# Patient Record
Sex: Female | Born: 1960 | Race: Black or African American | Hispanic: No | Marital: Married | Smoking: Former smoker
Health system: Southern US, Community
[De-identification: ages and names within clinical notes are randomized; demographics above are authoritative.]

## PROBLEM LIST (undated history)

## (undated) DIAGNOSIS — E785 Hyperlipidemia, unspecified: Secondary | ICD-10-CM

## (undated) DIAGNOSIS — G709 Myoneural disorder, unspecified: Secondary | ICD-10-CM

## (undated) DIAGNOSIS — M1990 Unspecified osteoarthritis, unspecified site: Secondary | ICD-10-CM

## (undated) HISTORY — PX: COLONOSCOPY: SHX174

## (undated) HISTORY — PX: ABDOMINAL HYSTERECTOMY: SHX81

## (undated) HISTORY — DX: Hyperlipidemia, unspecified: E78.5

## (undated) HISTORY — DX: Unspecified osteoarthritis, unspecified site: M19.90

## (undated) HISTORY — DX: Myoneural disorder, unspecified: G70.9

---

## 1997-07-26 ENCOUNTER — Emergency Department (HOSPITAL_COMMUNITY): Admission: EM | Admit: 1997-07-26 | Discharge: 1997-07-27 | Payer: Self-pay | Admitting: Emergency Medicine

## 1998-10-05 ENCOUNTER — Encounter: Payer: Self-pay | Admitting: Family Medicine

## 1998-10-05 ENCOUNTER — Encounter (INDEPENDENT_AMBULATORY_CARE_PROVIDER_SITE_OTHER): Payer: Self-pay | Admitting: Specialist

## 1998-10-05 ENCOUNTER — Ambulatory Visit (HOSPITAL_COMMUNITY): Admission: RE | Admit: 1998-10-05 | Discharge: 1998-10-05 | Payer: Self-pay | Admitting: Family Medicine

## 2000-05-18 ENCOUNTER — Other Ambulatory Visit: Admission: RE | Admit: 2000-05-18 | Discharge: 2000-05-18 | Payer: Self-pay | Admitting: Obstetrics and Gynecology

## 2001-09-23 ENCOUNTER — Other Ambulatory Visit: Admission: RE | Admit: 2001-09-23 | Discharge: 2001-09-23 | Payer: Self-pay | Admitting: Obstetrics and Gynecology

## 2001-10-10 ENCOUNTER — Encounter: Admission: RE | Admit: 2001-10-10 | Discharge: 2001-10-10 | Payer: Self-pay | Admitting: Obstetrics and Gynecology

## 2001-10-10 ENCOUNTER — Encounter: Payer: Self-pay | Admitting: Obstetrics and Gynecology

## 2001-10-10 ENCOUNTER — Encounter: Admission: RE | Admit: 2001-10-10 | Discharge: 2002-01-08 | Payer: Self-pay | Admitting: Family Medicine

## 2001-10-16 ENCOUNTER — Encounter: Payer: Self-pay | Admitting: Obstetrics and Gynecology

## 2001-10-16 ENCOUNTER — Encounter: Admission: RE | Admit: 2001-10-16 | Discharge: 2001-10-16 | Payer: Self-pay | Admitting: Obstetrics and Gynecology

## 2003-02-17 ENCOUNTER — Other Ambulatory Visit: Admission: RE | Admit: 2003-02-17 | Discharge: 2003-02-17 | Payer: Self-pay | Admitting: Obstetrics and Gynecology

## 2003-05-26 ENCOUNTER — Encounter (INDEPENDENT_AMBULATORY_CARE_PROVIDER_SITE_OTHER): Payer: Self-pay | Admitting: Specialist

## 2003-05-26 ENCOUNTER — Inpatient Hospital Stay (HOSPITAL_COMMUNITY): Admission: RE | Admit: 2003-05-26 | Discharge: 2003-05-28 | Payer: Self-pay | Admitting: Obstetrics and Gynecology

## 2004-01-17 HISTORY — PX: OTHER SURGICAL HISTORY: SHX169

## 2005-03-02 ENCOUNTER — Encounter: Admission: RE | Admit: 2005-03-02 | Discharge: 2005-03-02 | Payer: Self-pay | Admitting: Family Medicine

## 2006-02-13 ENCOUNTER — Encounter: Admission: RE | Admit: 2006-02-13 | Discharge: 2006-02-13 | Payer: Self-pay | Admitting: Obstetrics and Gynecology

## 2006-02-22 ENCOUNTER — Ambulatory Visit: Payer: Self-pay | Admitting: Cardiology

## 2007-03-13 ENCOUNTER — Emergency Department (HOSPITAL_COMMUNITY): Admission: EM | Admit: 2007-03-13 | Discharge: 2007-03-13 | Payer: Self-pay | Admitting: Emergency Medicine

## 2007-07-03 ENCOUNTER — Emergency Department (HOSPITAL_COMMUNITY): Admission: EM | Admit: 2007-07-03 | Discharge: 2007-07-03 | Payer: Self-pay | Admitting: Family Medicine

## 2008-11-09 ENCOUNTER — Encounter: Admission: RE | Admit: 2008-11-09 | Discharge: 2008-11-09 | Payer: Self-pay | Admitting: Family Medicine

## 2009-12-08 ENCOUNTER — Encounter: Admission: RE | Admit: 2009-12-08 | Discharge: 2009-12-08 | Payer: Self-pay | Admitting: Family Medicine

## 2010-02-06 ENCOUNTER — Encounter: Payer: Self-pay | Admitting: Obstetrics and Gynecology

## 2010-04-01 ENCOUNTER — Ambulatory Visit
Admission: RE | Admit: 2010-04-01 | Discharge: 2010-04-01 | Disposition: A | Discharge status: Home / Self Care | Payer: BC Managed Care – PPO | Source: Ambulatory Visit | Attending: Family Medicine | Admitting: Family Medicine

## 2010-04-01 ENCOUNTER — Other Ambulatory Visit: Payer: Self-pay | Admitting: Family Medicine

## 2010-04-01 DIAGNOSIS — M545 Low back pain: Secondary | ICD-10-CM

## 2010-04-18 ENCOUNTER — Ambulatory Visit: Payer: BC Managed Care – PPO

## 2010-04-25 ENCOUNTER — Ambulatory Visit: Payer: BC Managed Care – PPO | Attending: Family Medicine

## 2010-04-25 DIAGNOSIS — R5381 Other malaise: Secondary | ICD-10-CM | POA: Insufficient documentation

## 2010-04-25 DIAGNOSIS — M25569 Pain in unspecified knee: Secondary | ICD-10-CM | POA: Insufficient documentation

## 2010-04-25 DIAGNOSIS — M545 Low back pain, unspecified: Secondary | ICD-10-CM | POA: Insufficient documentation

## 2010-04-25 DIAGNOSIS — IMO0001 Reserved for inherently not codable concepts without codable children: Secondary | ICD-10-CM | POA: Insufficient documentation

## 2010-04-28 ENCOUNTER — Ambulatory Visit: Payer: BC Managed Care – PPO | Admitting: Physical Therapy

## 2010-05-02 ENCOUNTER — Ambulatory Visit: Payer: BC Managed Care – PPO

## 2010-05-04 ENCOUNTER — Ambulatory Visit: Payer: BC Managed Care – PPO

## 2010-05-09 ENCOUNTER — Ambulatory Visit: Payer: BC Managed Care – PPO

## 2010-05-16 ENCOUNTER — Ambulatory Visit: Payer: BC Managed Care – PPO | Admitting: Physical Therapy

## 2010-06-01 ENCOUNTER — Ambulatory Visit: Payer: BC Managed Care – PPO | Attending: Family Medicine | Admitting: Physical Therapy

## 2010-06-01 DIAGNOSIS — IMO0001 Reserved for inherently not codable concepts without codable children: Secondary | ICD-10-CM | POA: Insufficient documentation

## 2010-06-01 DIAGNOSIS — R5381 Other malaise: Secondary | ICD-10-CM | POA: Insufficient documentation

## 2010-06-01 DIAGNOSIS — M545 Low back pain, unspecified: Secondary | ICD-10-CM | POA: Insufficient documentation

## 2010-06-01 DIAGNOSIS — M25569 Pain in unspecified knee: Secondary | ICD-10-CM | POA: Insufficient documentation

## 2010-06-03 NOTE — H&P (Signed)
NAME:  [name], [name]                        ACCOUNT NO.:  [id number]   MEDICAL RECORD NO.:  [id number]                   PATIENT TYPE:  INP   LOCATION:  NA                                   FACILITY:  [facility]   PHYSICIAN:  Maxie Better, M.D.            DATE OF BIRTH:  August 01, 1960   DATE OF ADMISSION:  05/26/2003  DATE OF DISCHARGE:                                HISTORY & PHYSICAL   PREOPERATIVE HISTORY AND PHYSICAL:   CHIEF COMPLAINT:  Enlarging uterine fibroids, mild cervical dysplasia.   HISTORY OF PRESENT ILLNESS:  This is a 50 year old gravida 100, para 1-0-5-1  married black female with a history of a laparoscopic tubal ligation, last  menstrual period May 21, 2003, with enlarging uterine fibroids who is now  being admitted for a total abdominal hysterectomy.  The patient presented  for annual exam in February 2005, at that time she had regular cycles with  the second day increased flow, no intermenstrual bleeding or postcoital  bleeding.  Her exam was notable for a mass arising to the level of the  umbilicus, nontender, and her pelvic exam notable for uterus that was  anteverted, irregular, about 16 to 18 weeks size.  Due to the clinical  findings ultrasound was obtained on March 20, 2003.  The ultrasound revealed  uterus measuring 20.4 x 8.5 x 11.2 cm with multiple fibroids - the largest  is an anterior pedunculated fibroid measuring 7.26 cm and a posterior fundal  fibroid measuring 9 cm.  Both ovaries are noted to be normal and her  endometrium was thickened.  On comparison of prior ultrasounds (the last one  done on October 07, 2001) the uterus at that time measured 14.1 x 12.8 x  8.4 cm with multiple fibroids and the pedunculated fibroids not noted at  that time.  The patient had further evaluation which included an MRI of the  pelvis which confirmed multiple fibroids as well.  An endometrial biopsy was  performed on April 21, 2003 that showed benign secretory  endometrium, no  hyperplasia or malignancy.  The pelvic MRI was done on April 07, 2003.  Findings were prominent pelvic vascular congestion secondary to the uterine  enlargement, hypervascular leiomyomas at the fundal area of the uterus and  at the uterus had displaced the colon and the rectum and flattened the  bladder roof, bilateral pelvic cysts thought to be either small ovarian or  paraovarian cysts were noted and uterine size was confirmed at 17 cm x 9 x  10 cm.  In addition, the patient had an abnormal Pap smear for which she  underwent colposcopy and subsequent colposcopic directed biopsy in March  2005 that confirmed mild cervical dysplasia.  Her history is notable for  previous cervical dysplasia treated with cryosurgery.  Due to the enlarged  size, patient now presents for surgical management.   PAST MEDICAL HISTORY:   ALLERGIES:  No known drug allergies.  MEDICINES:  None.   MEDICAL HISTORY:  Mild pelvic endometriosis, uterine fibroids.   SURGICAL HISTORY:  Laparoscopic tubal ligation with bipolar cautery and  lysis of omental adhesions December 1995, cryosurgery, right eye surgery,  cesarean section, dilation and curettage.   OBSTETRICAL HISTORY:  Cesarean section x1.  Elective termination x2.   GYNECOLOGIC HISTORY:  CIN-1 condyloma.   FAMILY HISTORY:  Diabetes mother and father.  Father had prostate cancer,  mother glaucoma.  No ovarian, colon, or uterine cancer.   SOCIAL HISTORY:  Married, one child, CNA, nonsmoker.   REVIEW OF SYSTEMS:  Negative.   LABORATORIES:  Mammogram simple cysts in the past.   PHYSICAL EXAMINATION:  GENERAL:  Well-developed, well-nourished black female  in no acute distress.  VITAL SIGNS:  Blood pressure 100/68, pulse of 74, weight 162 pounds.  SKIN:  No lesions.  HEENT:  Anicteric sclerae.  Pink conjunctivae.  Oropharynx negative.  NECK:  Supple.  Thyroid not palpable.  NODES:  No supraclavicular, axillary, or inguinal nodes  palpable.  BREASTS:  Soft, nontender, no palpable mass, upward nipples without  discharge.  BACK:  No CVA tenderness.  LUNGS:  Clear to auscultation.  HEART:  Regular rate and rhythm without murmur.  ABDOMEN:  Possible mass just above the umbilicus, transverse scar noted.  Liver not palpable.  PELVIC:  Vulva shows no lesions.  Vagina no lesions.  Cervix is normal.  Uterus is irregular, anteverted, about 16 to 18 weeks size.  Adnexa not  appreciable secondary to the enlarged uterus.  RECTAL:  Exam deferred.  EXTREMITIES:  No edema.   IMPRESSION:  1. Enlarging uterine fibroids.  2. Mild cervical dysplasia.   PLAN:  Admission.  Total abdominal hysterectomy with ovarian preservation.  Routine admission labs and orders.  Risks of the procedure have been to the  patient and her husband including but not limited to infection, bleeding  which may require blood transfusion, injury to surrounding organ structures  such as the bladder, bowel, ureter, possible loss of the ovary due to blood  vessel compromise or abnormality of the ovaries, internal scar tissue which  could cause pain in the future or bowel obstruction, possible need for  removal of the ovaries in the past due to ovarian disease such as ovarian  cysts or ovarian cancer, fistula formation, blood transfusion risks  including but not limited to acute febrile reaction, HIV transmission and  hepatitis discussed.  Antibiotics prophylaxis planned, antiembolic stockings  planned to be used, postop care and __________ discharge were reviewed.  The  need for continued Pap smears was discussed with the patient.  All questions  answered.                                               Maxie Better, M.D.    [place]/MEDQ  D:  05/25/2003  T:  05/26/2003  Job:  [id number]

## 2010-06-03 NOTE — Op Note (Signed)
NAME:  Stacy [name], Stacy [name]                        ACCOUNT NO.:  [id number]   MEDICAL RECORD NO.:  [id number]                   PATIENT TYPE:  INP   LOCATION:  9317                                 FACILITY:  [facility]   PHYSICIAN:  Maxie Better, M.D.            DATE OF BIRTH:  04-26-1960   DATE OF PROCEDURE:  05/26/2003  DATE OF DISCHARGE:                                 OPERATIVE REPORT   PREOPERATIVE DIAGNOSES:  1. Enlarging uterine fibroids.  2. Mild cervical dysplasia.   PROCEDURES:  1. Exploratory laparotomy.  2. Total abdominal hysterectomy.   POSTOPERATIVE DIAGNOSES:  1. Enlarging uterine fibroids.  2. Mild cervical dysplasia.   ANESTHESIA:  General.   SURGEON:  Maxie Better, M.D.   ASSISTANT:  Stacy [name]. Rosalio Macadamia, M.D.   INDICATIONS:  This is [name] 50 year old gravida 6, para 1-0-5-1, female with  enlarging uterine fibroid and mild cervical dysplasia, who now presents for  surgical management.  Please see the dictated H&P for specific details.  Risks and benefits of the procedure have been explained to the patient and  her husband, consent has been signed.  The patient was transferred to the  operating room.   PROCEDURE:  Under adequate general anesthesia, the patient was placed in the  supine position.  Examination under anesthesia revealed [name] mobile uterus  approximately 16-18 weeks' size with multiple fibroids.  Most prominent is  the right lower uterine segment.  The adnexa could not be appreciated given  the enlarged uterus.  The patient was sterilely prepped and draped in the  usual fashion for an abdominal hysterectomy.  An indwelling Foley catheter  was sterilely placed.  The patient had had [name] previous cesarean section;  therefore, [name] Pfannenstiel skin incision was already noted.  Marcaine 0.25%  was injected along the previous incision.  [name] skin incision was then made,  carried down to the rectus fascia.  The rectus fascia was incised in the  midline,  extended bilaterally.  The rectus fascia was then bluntly and  sharply dissected off the rectus muscle in [name] superior and inferior fashion.  The rectus muscle was split in the midline, and the parietal peritoneum was  entered sharply and extended superiorly and inferiorly.  On entering the  abdominal cavity it was notable for an enlarged mass consistent with [name]  fibroid uterus and multiple fibroids distorting the normal architecture of  the uterus.  The right ovary was normal.  The right tube had surgical  separation noted.  The left ovary was also normal.  The left tube had  surgical separation.  There was an omental adhesion over the left fundal  area of the uterus, which was then lysed.  The appendix was noted to be  normal and elongated.  The uterus was exteriorized to facilitate the  surgery.  Multiple fibroids were noted.  The round ligaments were  bilaterally grasped with Babcocks, suture ligated with 0 Vicryl, and severed  using cautery.  The posterior leaf of the broad ligament was then opened  bilaterally.  The utero-ovarian ligaments were bilaterally clamped, cut, and  free tied with 0 Vicryl and suture ligated with 0 Vicryl, good hemostasis  noted.  Attention was then turned to the round ligament, at which time it  was noted that the bladder reflection was high on the uterus.  The bladder  reflection was then followed at that junction and the bladder was then  sharply dissected off the lower uterine segment and displaced inferiorly.  The uterine vessels were then bilaterally skeletonized, doubly clamped on  the right, single clamped on the left, severed, and suture ligated with 0  Vicryl bilaterally with the inferior aspect of the vessels being identified  on the left and clamped, cut, and suture ligated with 0 Vicryl suture.  The  cardinal ligaments were then bilaterally clamped, cut, and suture ligated  with 0 Vicryl until the utero-ovarian ligament was reached, at which point   on the right the utero-ovarian ligament was then clamped, cut, and suture  ligated with 0 Vicryl.  On the left it was less defined and had been  incorporated in prior clamping.  This was continued until the cervicovaginal  junction was reached.  This was being done in conjunction with the bladder  being further sharply dissected and displaced inferiorly.  The  cervicovaginal junction was then reached and bilateral clamping of the  cervicovaginal junction with subsequent severing of the cervix from its  vaginal attachment was then performed without incident.  The vaginal cuff  was then suture ligated with 0 Vicryl sutures.  The intervening small  remaining opening was then suture ligated with 0 Vicryl x2.  There was some  bleeding along the posterior peritoneal edge near the vaginal cuff, and this  was reefed with 3-0 Vicryl suture.  The ureters were noted to be normal  caliber and peristalsing bilaterally.  The pedicles for the ovaries  bilaterally were very loose and displaces the ovary overlying the vaginal  cuff, and therefore the pedicles of the ovaries were bilaterally suspended  to the round ligaments bilaterally.  Small bleeders were then cauterized  overlying the bladder as well as the peritoneal edges on the right where the  ovary had been removed.  No pelvic endometriosis was noted.  The abdomen was  then copiously irrigated, suctioned of debris.  The pedicles were inspected,  good hemostasis noted.  The uterosacral ligament on the right was suspended  to the vaginal cuff on the right.  The appendix again was noted to be  normal.  Palpation of the upper abdomen was noted to be normal liver edge,  normal palpable kidneys.  With good hemostasis then noted, the parietal  peritoneum was not closed.  The undersurface of the rectus fascia was  inspected, small bleeders cauterized.  There was [name] Stacy [name] buttonhole defect in the fascia superiorly, and this was approximated using 0 Vicryl  figure-of-  eight suture.  The rectus fascia was closed with 0 Vicryl x2.  The  subcutaneous area was irrigated and suctioned, small bleeders cauterized.  The skin was approximated using Ethicon staples.  Specimen was the uterus  with cervix.  Estimated blood loss was 75 mL.  Urine output was 25 mL clear  yellow urine.  The patient had had minimal urine at the insertion of the  indwelling Foley catheter at the start of surgery.  Intraoperative fluid was  1800 mL crystalloid.  Sponge and instrument count  x2 was correct.  Complication was none.  The patient tolerated the procedure well and was  transferred to the recovery room in stable condition.                                               Maxie Better, M.D.    [place]/MEDQ  D:  05/26/2003  T:  05/27/2003  Job:  [id number]

## 2010-06-03 NOTE — Assessment & Plan Note (Signed)
[facility]                            CARDIOLOGY OFFICE NOTE   NAME:[name], [name]                     MRN:          [id number]  DATE:02/22/2006                            DOB:          [birth year]    I was asked by Dr. Cherly Hensen to evaluate Stacy [name], a delightful 50-  year-old [place] American female with hyperlipidemia.   HISTORY OF PRESENT ILLNESS:  She is 50 years of age, separated, and has  1 child.  She works as a Lawyer.   She says her cholesterol has always been around 230 to 240.  Her father  takes Lipitor.  Her recent lipid profile showed a total cholesterol of  233, LDL of 158, cholesterol to HDL ratio of 4.8, HDL 49, triglycerides  of 528.   She has no other conventional risk factors except for remote smoking,  which she quit in 2000.   She is having no symptoms of angina or ischemia.  She does not walk on a  regular basis.   PAST MEDICAL HISTORY:  She is intolerant of:  1. CODEINE.  2. COUGH SYRUP.   She is currently on no medications.   She had a tubal ligation in the past, a C-section and some fibroids  removed.   FAMILY HISTORY:  Really negative for premature heart disease.   SOCIAL HISTORY:  She is a Lawyer.  She is separated and has 1 child.   REVIEW OF SYSTEMS:  She has a history of an ulcer, otherwise her review  of systems are negative.   EXAMINATION:  She is very pleasant.  Her blood pressure today is 124/73.  Her pulse is 73 and regular.  She  is 5 feet 5-3/4.  She weighs 163 pounds.  HEENT:  Normocephalic and atraumatic.  PERRLA.  Extraocular movements  intact.  Sclerae are clear.  Facial symmetry is normal.  Carotid upstrokes are equal bilaterally without bruits.  No JVD.  Thyroid is not enlarged or palpable.  LUNGS:  Clear.  HEART:  Reveals a soft S1 and S2, non-displaced PMI, split S2.  ABDOMINAL EXAM:  Soft with good bowel sounds.  No midline bruit.  EXTREMITIES:  No cyanosis, clubbing or edema.  Pulses  are intact.  NEUROLOGIC:  Exam is intact.   Her EKG is normal.   ASSESSMENT AND PLAN:  Per the Framingham Global Risk Score, she has  about a 1% chance of having a coronary event in the next 10 years.  At  this time, I would not recommend pharmacological therapy.  However, I  have asked her to do a walking program 3 hours a week, and referred her  to a low-saturated fat, low-carbohydrate diet with some information  given today.   I would advise her lipids to be checked on a regular basis.     Thomas C. Daleen Squibb, MD, [facility]  Electronically Signed    [facility]/MedQ  DD: 02/22/2006  DT: 02/22/2006  Job #: [id number]   cc:   Maxie Better, M.D.

## 2010-06-03 NOTE — Discharge Summary (Signed)
NAME:  [name], [name]                        ACCOUNT NO.:  [id number]   MEDICAL RECORD NO.:  [id number]                   PATIENT TYPE:  INP   LOCATION:  9317                                 [facility]ILITY[facility]   PHYSICIAN:  Maxie Better, M.D.            DATE OF BIRTH:  01-10-1961   DATE OF ADMISSION:  05/26/2003  DATE OF DISCHARGE:                                 DISCHARGE SUMMARY   ADMISSION DIAGNOSES:  1. Enlarging uterine fibroids.  2. Mild cervical dysplasia.   DISCHARGE DIAGNOSES:  1. Enlarging uterine fibroids.  2. Mild cervical dysplasia.  3. Postoperative anemia.   PROCEDURE:  Total abdominal hysterectomy.   HISTORY OF PRESENT ILLNESS:  Please see the dictated History and Physical.  A 50 year old gravida 6 para 1-0-5-1 married black female with a history of  tubal ligation and enlarging uterine fibroids with mild cervical dysplasia  who now presents for surgical management.   HOSPITAL COURSE:  The patient was admitted to [facility].  She was  taken to the operating room where she underwent a total abdominal  hysterectomy under general anesthesia.  Findings at the time of surgery was  an 18-week size fibroid uterus with omental adhesion to the left anterior  fundal area.  Surgical separation of tubes was noted bilaterally. Normal  ovaries are noted bilaterally.  Normal liver edge with normal palpable  kidney, normal appendix.  The uterus weighed 930 g in the operating room.  Her postoperative course was unremarkable.  Her CBC on postoperative day #1  showed a hemoglobin of 10.6, and platelet count of 238,00.  The patient was  passing flatus, tolerating a regular diet, remained afebrile throughout her  hospital course.  She was deemed well to be discharged home.   DISPOSITION:  Home.   CONDITION:  Stable.   DISCHARGE MEDICATIONS:  1. Tylox one to two tablets q.3-4h. p.r.n. pain #30.  2. Motrin 800 mg one p.o. q.6-8h. p.r.n. pain.  3. Over-the-counter iron  supplementation one p.o. daily.  4. Colace 100 mg one p.o. b.i.d.   Follow-up appointment is in 4-6 weeks at [facility], staple removal in  the office next Tuesday.  Discharge instructions were reviewed and a list of  discharge instructions given to the patient.  Final pathology is pending.                                               Maxie Better, M.D.    [place]/MEDQ  D:  05/28/2003  T:  05/28/2003  Job:  [id number]

## 2010-06-17 ENCOUNTER — Other Ambulatory Visit: Payer: Self-pay | Admitting: Obstetrics

## 2010-07-11 ENCOUNTER — Encounter: Payer: Self-pay | Admitting: Internal Medicine

## 2010-07-11 ENCOUNTER — Ambulatory Visit (AMBULATORY_SURGERY_CENTER): Payer: BC Managed Care – PPO | Admitting: *Deleted

## 2010-07-11 VITALS — Ht 65.0 in | Wt 162.4 lb

## 2010-07-11 DIAGNOSIS — Z1211 Encounter for screening for malignant neoplasm of colon: Secondary | ICD-10-CM

## 2010-07-11 DIAGNOSIS — Z800 Family history of malignant neoplasm of digestive organs: Secondary | ICD-10-CM

## 2010-07-11 MED ORDER — PEG-KCL-NACL-NASULF-NA ASC-C 100 G PO SOLR: ORAL | Status: DC

## 2010-07-25 ENCOUNTER — Encounter: Payer: Self-pay | Admitting: Internal Medicine

## 2010-07-25 ENCOUNTER — Ambulatory Visit (AMBULATORY_SURGERY_CENTER): Payer: BC Managed Care – PPO | Admitting: Internal Medicine

## 2010-07-25 DIAGNOSIS — Z1211 Encounter for screening for malignant neoplasm of colon: Secondary | ICD-10-CM

## 2010-07-25 DIAGNOSIS — Z800 Family history of malignant neoplasm of digestive organs: Secondary | ICD-10-CM

## 2010-07-25 MED ORDER — SODIUM CHLORIDE 0.9 % IV SOLN: 500.0000 mL | INTRAVENOUS | Status: AC

## 2010-07-25 NOTE — Patient Instructions (Signed)
Refer to the green and blue discharge sheet for instructions today.  See the picture page for your findings from the colonoscopy exam today.  Resume your prior medications today.  Please call if any questions or concerns.

## 2010-07-26 ENCOUNTER — Telehealth: Payer: Self-pay | Admitting: *Deleted

## 2010-07-26 NOTE — Telephone Encounter (Signed)
No answer

## 2010-10-13 LAB — POCT RAPID STREP A: Streptococcus, Group A Screen (Direct): NEGATIVE

## 2011-06-23 ENCOUNTER — Other Ambulatory Visit: Payer: Self-pay | Admitting: Obstetrics

## 2011-06-23 DIAGNOSIS — Z1231 Encounter for screening mammogram for malignant neoplasm of breast: Secondary | ICD-10-CM

## 2011-07-18 ENCOUNTER — Ambulatory Visit
Admission: RE | Admit: 2011-07-18 | Discharge: 2011-07-18 | Disposition: A | Discharge status: Home / Self Care | Payer: BC Managed Care – PPO | Source: Ambulatory Visit | Attending: Obstetrics | Admitting: Obstetrics

## 2011-07-18 DIAGNOSIS — Z1231 Encounter for screening mammogram for malignant neoplasm of breast: Secondary | ICD-10-CM

## 2012-04-06 ENCOUNTER — Ambulatory Visit (INDEPENDENT_AMBULATORY_CARE_PROVIDER_SITE_OTHER): Payer: BC Managed Care – PPO | Admitting: Family Medicine

## 2012-04-06 VITALS — BP 110/82 | HR 70 | Temp 98.8°F | Resp 16 | Ht 65.0 in | Wt 171.0 lb

## 2012-04-06 DIAGNOSIS — J019 Acute sinusitis, unspecified: Secondary | ICD-10-CM

## 2012-04-06 MED ORDER — LEVOFLOXACIN 500 MG PO TABS: 500.0000 mg | ORAL_TABLET | Freq: Every day | ORAL | Status: AC

## 2012-04-06 NOTE — Progress Notes (Signed)
Patient ID: Stacy [name] MRN: [id number], DOB: 12-26-1960, 52 y.o. Date of Encounter: 04/06/2012, 10:05 AM  Primary Physician: Hollice Espy, MD  Chief Complaint:  Chief Complaint  Patient presents with  . Sinusitis  . Headache    HPI: 52 y.o. year old female presents with 2 day history of nasal congestion, post nasal drip, sore throat, sinus pressure, and cough. Afebrile. No chills. Nasal congestion thick and green/yellow. Sinus pressure is the worst symptom. Cough is productive secondary to post nasal drip and not associated with time of day. Ears feel full, leading to sensation of muffled hearing. Has tried OTC cold preps without success. No GI complaints.   No recent antibiotics, recent travels, or sick contacts   No leg trauma, sedentary periods, h/o cancer, or tobacco use.  Past Medical History  Diagnosis Date  . Hyperlipidemia   . Genital herpes   . Arthritis      Home Meds: Prior to Admission medications   Medication Sig Start Date End Date Taking? Authorizing Provider  calcium-vitamin D (OSCAL WITH D) 500-200 MG-UNIT per tablet Take 1 tablet by mouth 2 (two) times daily.     Yes Historical Provider, MD  ergocalciferol (VITAMIN D2) 50000 UNITS capsule Take 50,000 Units by mouth once a week.     Yes Historical Provider, MD  lovastatin (MEVACOR) 20 MG tablet Take 20 mg by mouth at bedtime.     Yes Historical Provider, MD  meloxicam (MOBIC) 15 MG tablet Take 15 mg by mouth as needed for pain.   Yes Historical Provider, MD  valACYclovir (VALTREX) 500 MG tablet Take 500 mg by mouth daily.     Yes Historical Provider, MD  Multiple Vitamin (MULTIVITAMIN PO) Take by mouth. Takes 1/2 tablet daily     Historical Provider, MD    Allergies: No Known Allergies  History   Social History  . Marital Status: Married    Spouse Name: N/A    Number of Children: N/A  . Years of Education: N/A   Occupational History  . Not on file.   Social History Main Topics  . Smoking  status: Former Smoker -- 0.30 packs/day    Types: Cigarettes  . Smokeless tobacco: Not on file  . Alcohol Use: Yes     Comment: occasional alcohol intake  . Drug Use: No  . Sexually Active: Not on file   Other Topics Concern  . Not on file   Social History Narrative  . No narrative on file     Review of Systems: Constitutional: negative for chills, fever, night sweats or weight changes Cardiovascular: negative for chest pain or palpitations Respiratory: negative for hemoptysis, wheezing, or shortness of breath Abdominal: negative for abdominal pain, nausea, vomiting or diarrhea Dermatological: negative for rash Neurologic: negative for headache   Physical Exam: Blood pressure 110/82, pulse 70, temperature 98.8 F (37.1 C), temperature source Oral, resp. rate 16, height 5\' 5"  (1.651 m), weight 171 lb (77.565 kg), SpO2 100.00%., Body mass index is 28.46 kg/(m^2). General: Well developed, well nourished, in no acute distress. Head: Normocephalic, atraumatic, eyes without discharge, sclera non-icteric, nares are congested. Bilateral auditory canals clear, TM's are without perforation, pearly grey with reflective cone of light bilaterally. Serous effusion bilaterally behind TM's. Maxillary sinus TTP. Oral cavity moist, dentition normal. Posterior pharynx with post nasal drip and mild erythema. No peritonsillar abscess or tonsillar exudate. Neck: Supple. No thyromegaly. Full ROM. No lymphadenopathy. Lungs: Clear bilaterally to auscultation without wheezes, rales, or rhonchi. Breathing  is unlabored.  Heart: RRR with S1 S2. No murmurs, rubs, or gallops appreciated. Msk:  Strength and tone normal for age. Extremities: No clubbing or cyanosis. No edema. Neuro: Alert and oriented X 3. Moves all extremities spontaneously. CNII-XII grossly in tact. Psych:  Responds to questions appropriately with a normal affect.      ASSESSMENT AND PLAN:  52 y.o. year old female with  sinusitis -  -Tylenol/Motrin prn -Rest/fluids -RTC precautions -RTC 3-5 days if no improvement  Signed, Elvina Sidle, MD 04/06/2012 10:05 AM

## 2012-04-06 NOTE — Patient Instructions (Addendum)

## 2012-11-15 ENCOUNTER — Other Ambulatory Visit: Payer: Self-pay

## 2012-11-15 DIAGNOSIS — Z1231 Encounter for screening mammogram for malignant neoplasm of breast: Secondary | ICD-10-CM

## 2012-12-24 ENCOUNTER — Ambulatory Visit
Admission: RE | Admit: 2012-12-24 | Discharge: 2012-12-24 | Disposition: A | Discharge status: Home / Self Care | Payer: BC Managed Care – PPO | Source: Ambulatory Visit

## 2012-12-24 DIAGNOSIS — Z1231 Encounter for screening mammogram for malignant neoplasm of breast: Secondary | ICD-10-CM

## 2012-12-26 ENCOUNTER — Other Ambulatory Visit: Payer: Self-pay | Admitting: Family Medicine

## 2012-12-26 DIAGNOSIS — R928 Other abnormal and inconclusive findings on diagnostic imaging of breast: Secondary | ICD-10-CM

## 2013-01-13 ENCOUNTER — Ambulatory Visit
Admission: RE | Admit: 2013-01-13 | Discharge: 2013-01-13 | Disposition: A | Discharge status: Home / Self Care | Payer: BC Managed Care – PPO | Source: Ambulatory Visit | Attending: Family Medicine | Admitting: Family Medicine

## 2013-01-13 DIAGNOSIS — R928 Other abnormal and inconclusive findings on diagnostic imaging of breast: Secondary | ICD-10-CM

## 2013-07-31 ENCOUNTER — Other Ambulatory Visit: Payer: Self-pay | Admitting: Obstetrics

## 2013-07-31 DIAGNOSIS — N6310 Unspecified lump in the right breast, unspecified quadrant: Secondary | ICD-10-CM

## 2013-08-04 ENCOUNTER — Ambulatory Visit
Admission: RE | Admit: 2013-08-04 | Discharge: 2013-08-04 | Disposition: A | Discharge status: Home / Self Care | Payer: BC Managed Care – PPO | Source: Ambulatory Visit | Attending: Obstetrics | Admitting: Obstetrics

## 2013-08-04 DIAGNOSIS — N6310 Unspecified lump in the right breast, unspecified quadrant: Secondary | ICD-10-CM

## 2014-01-27 ENCOUNTER — Other Ambulatory Visit: Payer: Self-pay | Admitting: Obstetrics

## 2014-01-27 DIAGNOSIS — N63 Unspecified lump in unspecified breast: Secondary | ICD-10-CM

## 2014-02-05 ENCOUNTER — Ambulatory Visit
Admission: RE | Admit: 2014-02-05 | Discharge: 2014-02-05 | Disposition: A | Discharge status: Home / Self Care | Payer: 59 | Source: Ambulatory Visit | Attending: Obstetrics | Admitting: Obstetrics

## 2014-02-05 DIAGNOSIS — N63 Unspecified lump in unspecified breast: Secondary | ICD-10-CM

## 2015-04-26 ENCOUNTER — Other Ambulatory Visit: Payer: Self-pay

## 2015-04-26 DIAGNOSIS — Z1231 Encounter for screening mammogram for malignant neoplasm of breast: Secondary | ICD-10-CM

## 2015-05-18 ENCOUNTER — Ambulatory Visit: Payer: Self-pay

## 2015-06-18 ENCOUNTER — Other Ambulatory Visit: Payer: Self-pay | Admitting: Family Medicine

## 2015-06-18 DIAGNOSIS — R200 Anesthesia of skin: Secondary | ICD-10-CM

## 2015-06-18 DIAGNOSIS — M79605 Pain in left leg: Secondary | ICD-10-CM

## 2015-06-26 ENCOUNTER — Ambulatory Visit
Admission: RE | Admit: 2015-06-26 | Discharge: 2015-06-26 | Disposition: A | Discharge status: Home / Self Care | Payer: Self-pay | Source: Ambulatory Visit | Attending: Family Medicine | Admitting: Family Medicine

## 2015-06-26 DIAGNOSIS — M79605 Pain in left leg: Secondary | ICD-10-CM

## 2015-06-26 DIAGNOSIS — R200 Anesthesia of skin: Secondary | ICD-10-CM

## 2015-06-29 ENCOUNTER — Encounter: Payer: Self-pay | Admitting: Gastroenterology

## 2015-07-22 ENCOUNTER — Ambulatory Visit
Admission: RE | Admit: 2015-07-22 | Discharge: 2015-07-22 | Disposition: A | Discharge status: Home / Self Care | Payer: 59 | Source: Ambulatory Visit

## 2015-07-22 DIAGNOSIS — Z1231 Encounter for screening mammogram for malignant neoplasm of breast: Secondary | ICD-10-CM

## 2016-12-21 ENCOUNTER — Encounter: Payer: Self-pay | Admitting: Family Medicine

## 2017-08-10 ENCOUNTER — Encounter: Payer: Self-pay | Admitting: Gastroenterology

## 2017-10-01 ENCOUNTER — Encounter: Payer: Self-pay | Admitting: Gastroenterology

## 2017-10-01 ENCOUNTER — Ambulatory Visit (AMBULATORY_SURGERY_CENTER): Payer: Self-pay | Admitting: *Deleted

## 2017-10-01 VITALS — Ht 65.75 in | Wt 188.6 lb

## 2017-10-01 DIAGNOSIS — Z800 Family history of malignant neoplasm of digestive organs: Secondary | ICD-10-CM

## 2017-10-01 MED ORDER — NA SULFATE-K SULFATE-MG SULF 17.5-3.13-1.6 GM/177ML PO SOLN: 1.0000 | Freq: Once | ORAL | 0 refills | Status: AC

## 2017-10-01 NOTE — Progress Notes (Signed)
No egg or soy allergy known to patient  No issues with past sedation with any surgeries  or procedures, no intubation problems  No diet pills per patient No home 02 use per patient  No blood thinners per patient  Pt denies issues with constipation  No A fib or A flutter  EMMI video sent to pt's e mail - pt declined  $15 suprep coupon to pt in PV today   

## 2017-10-09 ENCOUNTER — Ambulatory Visit (AMBULATORY_SURGERY_CENTER): Payer: 59 | Admitting: Gastroenterology

## 2017-10-09 ENCOUNTER — Encounter: Payer: Self-pay | Admitting: Gastroenterology

## 2017-10-09 VITALS — BP 114/68 | HR 67 | Temp 97.8°F | Resp 16 | Ht 65.0 in | Wt 188.0 lb

## 2017-10-09 DIAGNOSIS — Z800 Family history of malignant neoplasm of digestive organs: Secondary | ICD-10-CM | POA: Diagnosis not present

## 2017-10-09 DIAGNOSIS — Z1211 Encounter for screening for malignant neoplasm of colon: Secondary | ICD-10-CM | POA: Diagnosis not present

## 2017-10-09 MED ORDER — SODIUM CHLORIDE 0.9 % IV SOLN: 500.0000 mL | Freq: Once | INTRAVENOUS | Status: AC

## 2017-10-09 NOTE — Progress Notes (Signed)
Pt's states no medical or surgical changes since previsit or office visit. 

## 2017-10-09 NOTE — Progress Notes (Signed)
Alert and oriented x3, pleased with MAC, report to RN  

## 2017-10-09 NOTE — Op Note (Addendum)
[facility] Patient Name: Stacy [name] Procedure Date: 10/09/2017 9:15 AM MRN: [id number] Endoscopist: Napoleon Form , MD Age: 57 Referring MD:  Date of Birth: [birth year] Gender: Female Account #: [id number] Procedure:                Colonoscopy Indications:              Screening patient at increased risk: Family history                            of 1st-degree relative with colorectal cancer at                            age 31 years (or older) Medicines:                Monitored Anesthesia Care Procedure:                Pre-Anesthesia Assessment:                           - Prior to the procedure, a History and Physical                            was performed, and patient medications and                            allergies were reviewed. The patient's tolerance of                            previous anesthesia was also reviewed. The risks                            and benefits of the procedure and the sedation                            options and risks were discussed with the patient.                            All questions were answered, and informed consent                            was obtained. Prior Anticoagulants: The patient has                            taken no previous anticoagulant or antiplatelet                            agents. ASA Grade Assessment: II - A patient with                            mild systemic disease. After reviewing the risks                            and benefits, the patient was deemed in  satisfactory condition to undergo the procedure.                           After obtaining informed consent, the colonoscope                            was passed under direct vision. Throughout the                            procedure, the patient's blood pressure, pulse, and                            oxygen saturations were monitored continuously. The                            Colonoscope was introduced  through the anus and                            advanced to the the terminal ileum, with                            identification of the appendiceal orifice and IC                            valve. The colonoscopy was performed without                            difficulty. The patient tolerated the procedure                            well. The quality of the bowel preparation was                            excellent. The terminal ileum, ileocecal valve,                            appendiceal orifice, and rectum were photographed. Scope In: 9:32:44 AM Scope Out: 9:47:58 AM Scope Withdrawal Time: 0 hours 11 minutes 56 seconds  Total Procedure Duration: 0 hours 15 minutes 14 seconds  Findings:                 The perianal and digital rectal examinations were                            normal.                           Scattered small-mouthed diverticula were found in                            the sigmoid colon, descending colon and ascending                            colon.  Non-bleeding internal hemorrhoids were found during                            retroflexion. The hemorrhoids were small. Complications:            No immediate complications. Estimated Blood Loss:     Estimated blood loss: none. Impression:               - Mild diverticulosis in the sigmoid colon, in the                            descending colon and in the ascending colon.                           - Non-bleeding internal hemorrhoids.                           - No specimens collected. Recommendation:           - Patient has a contact number available for                            emergencies. The signs and symptoms of potential                            delayed complications were discussed with the                            patient. Return to normal activities tomorrow.                            Written discharge instructions were provided to the                             patient.                           - Resume previous diet.                           - Continue present medications.                           - Repeat colonoscopy in 5 years for screening                            purposes. Napoleon FormKavitha V. Ameli Sangiovanni, MD 10/09/2017 9:52:41 AM This report has been signed electronically.

## 2017-10-09 NOTE — Patient Instructions (Signed)
Discharge instructions given. Handouts on diverticulosis and hemorrhoids. Resume previous medications. YOU HAD AN ENDOSCOPIC PROCEDURE TODAY AT THE [facility]:   Refer to the procedure report that was given to you for any specific questions about what was found during the examination.  If the procedure report does not answer your questions, please call your gastroenterologist to clarify.  If you requested that your care partner not be given the details of your procedure findings, then the procedure report has been included in a sealed envelope for you to review at your convenience later.  YOU SHOULD EXPECT: Some feelings of bloating in the abdomen. Passage of more gas than usual.  Walking can help get rid of the air that was put into your GI tract during the procedure and reduce the bloating. If you had a lower endoscopy (such as a colonoscopy or flexible sigmoidoscopy) you may notice spotting of blood in your stool or on the toilet paper. If you underwent a bowel prep for your procedure, you may not have a normal bowel movement for a few days.  Please Note:  You might notice some irritation and congestion in your nose or some drainage.  This is from the oxygen used during your procedure.  There is no need for concern and it should clear up in a day or so.  SYMPTOMS TO REPORT IMMEDIATELY:   Following lower endoscopy (colonoscopy or flexible sigmoidoscopy):  Excessive amounts of blood in the stool  Significant tenderness or worsening of abdominal pains  Swelling of the abdomen that is new, acute  Fever of 100F or higher   For urgent or emergent issues, a gastroenterologist can be reached at any hour by calling [phone].   DIET:  We do recommend a small meal at first, but then you may proceed to your regular diet.  Drink plenty of fluids but you should avoid alcoholic beverages for 24 hours.  ACTIVITY:  You should plan to take it easy for the rest of today and you should  NOT DRIVE or use heavy machinery until tomorrow (because of the sedation medicines used during the test).    FOLLOW UP: Our staff will call the number listed on your records the next business day following your procedure to check on you and address any questions or concerns that you may have regarding the information given to you following your procedure. If we do not reach you, we will leave a message.  However, if you are feeling well and you are not experiencing any problems, there is no need to return our call.  We will assume that you have returned to your regular daily activities without incident.  If any biopsies were taken you will be contacted by phone or by letter within the next 1-3 weeks.  Please call us at [phone] if you have not heard about the biopsies in 3 weeks.    SIGNATURES/CONFIDENTIALITY: You and/or your care partner have signed paperwork which will be entered into your electronic medical record.  These signatures attest to the fact that that the information above on your After Visit Summary has been reviewed and is understood.  Full responsibility of the confidentiality of this discharge information lies with you and/or your care-partner. 

## 2017-10-10 ENCOUNTER — Telehealth: Payer: Self-pay

## 2017-10-10 NOTE — Telephone Encounter (Signed)
  Follow up Call-  Call back number 10/09/2017  Post procedure Call Back phone  # [phone] cell  Permission to leave phone message Yes  Some recent data might be hidden     Patient questions:  Do you have a fever, pain , or abdominal swelling? No. Pain Score  0 *  Have you tolerated food without any problems? Yes.    Have you been able to return to your normal activities? Yes.    Do you have any questions about your discharge instructions: Diet   No. Medications  No. Follow up visit  No.  Do you have questions or concerns about your Care? No.  Actions: * If pain score is 4 or above: No action needed, pain <4.

## 2017-12-06 ENCOUNTER — Ambulatory Visit
Admission: RE | Admit: 2017-12-06 | Discharge: 2017-12-06 | Disposition: A | Discharge status: Home / Self Care | Payer: 59 | Source: Ambulatory Visit | Attending: Family Medicine | Admitting: Family Medicine

## 2017-12-06 ENCOUNTER — Other Ambulatory Visit: Payer: Self-pay | Admitting: Family Medicine

## 2017-12-06 DIAGNOSIS — R059 Cough, unspecified: Secondary | ICD-10-CM

## 2017-12-06 DIAGNOSIS — R05 Cough: Secondary | ICD-10-CM

## 2020-01-15 IMAGING — DX DG CHEST 2V
2 series · 2 of 2 positions shown · non-contrast
Comparison: None.

CLINICAL DATA: Cough.

EXAM:
CHEST - 2 VIEW

[dg chest 2 view (1 of 2)]
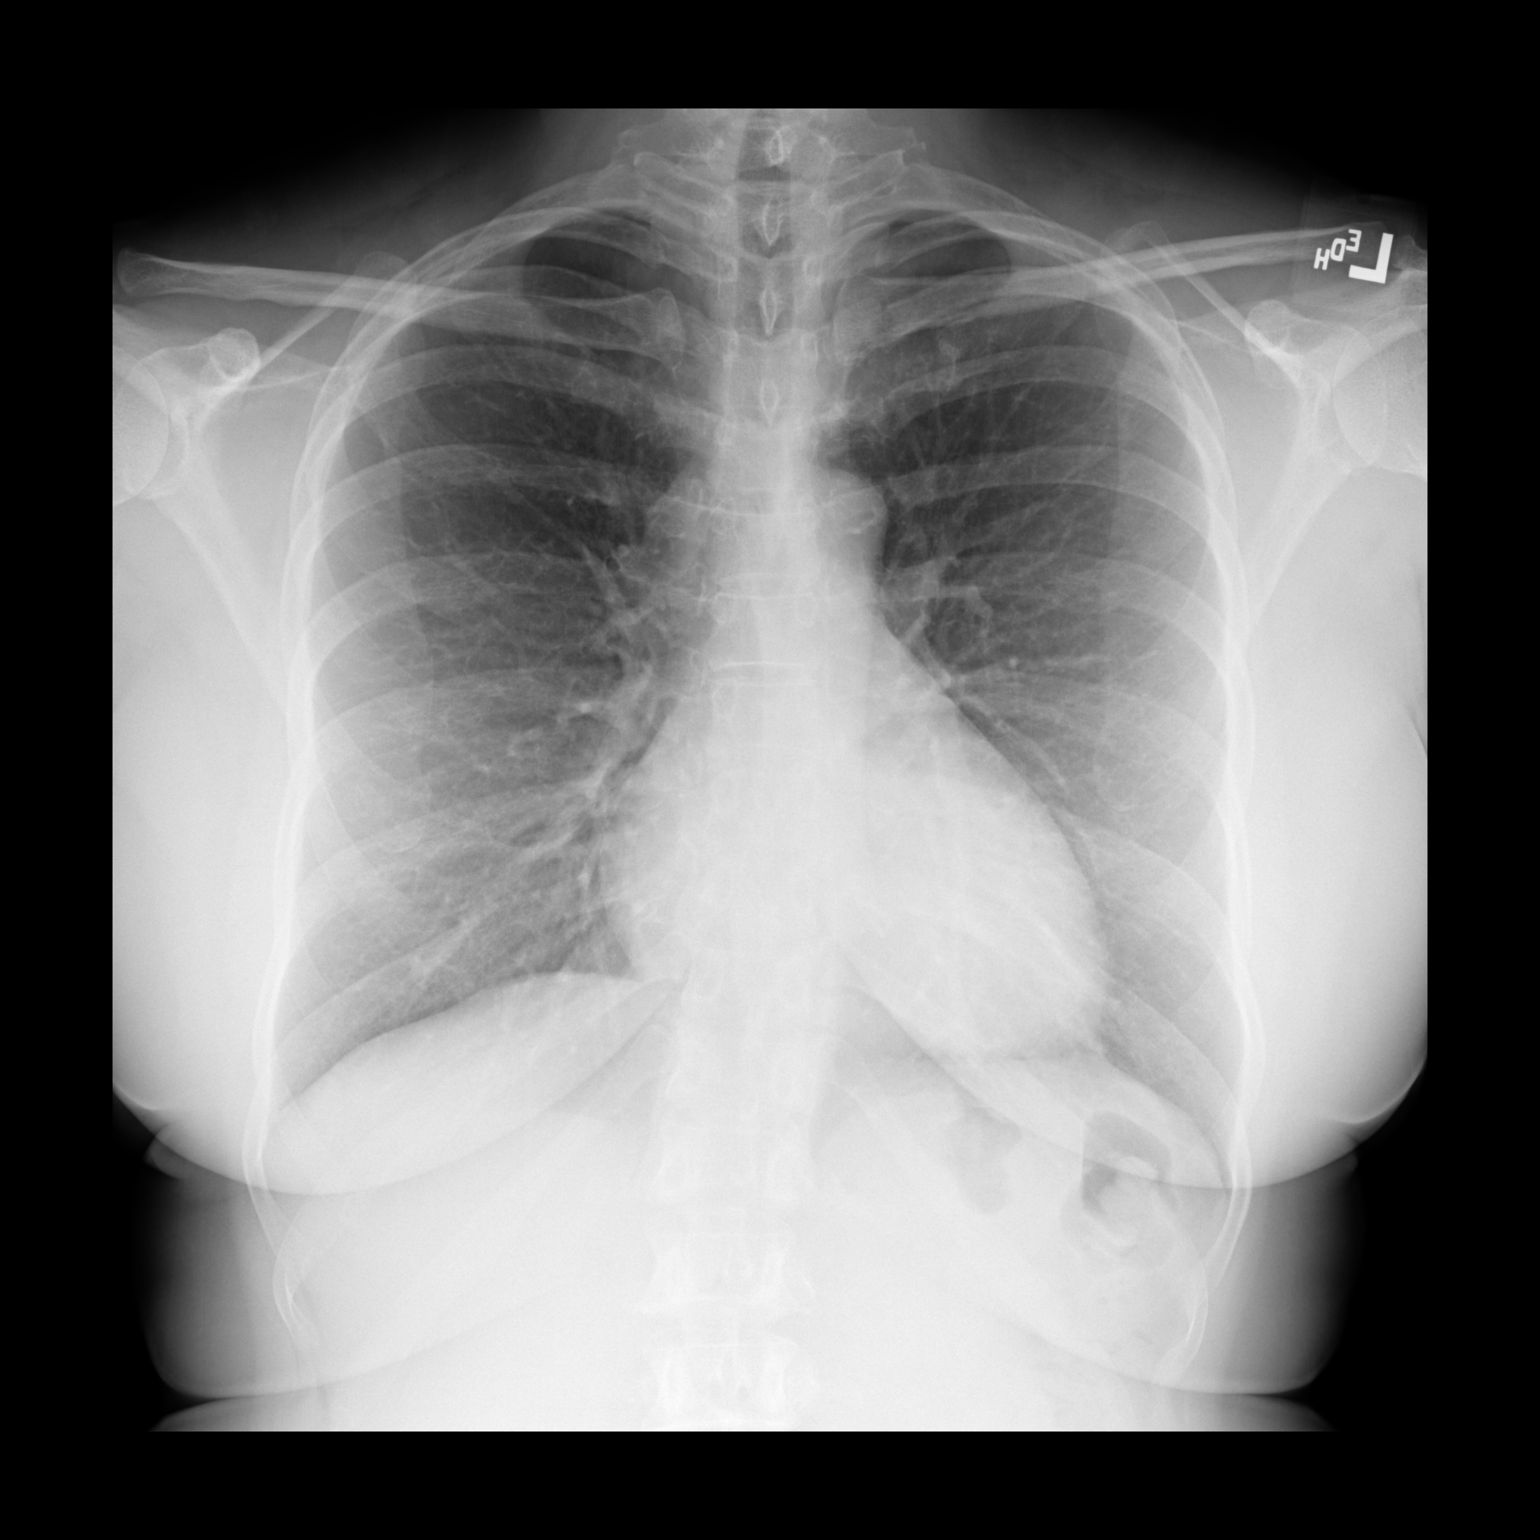

[dg chest 2 view (2 of 2)]
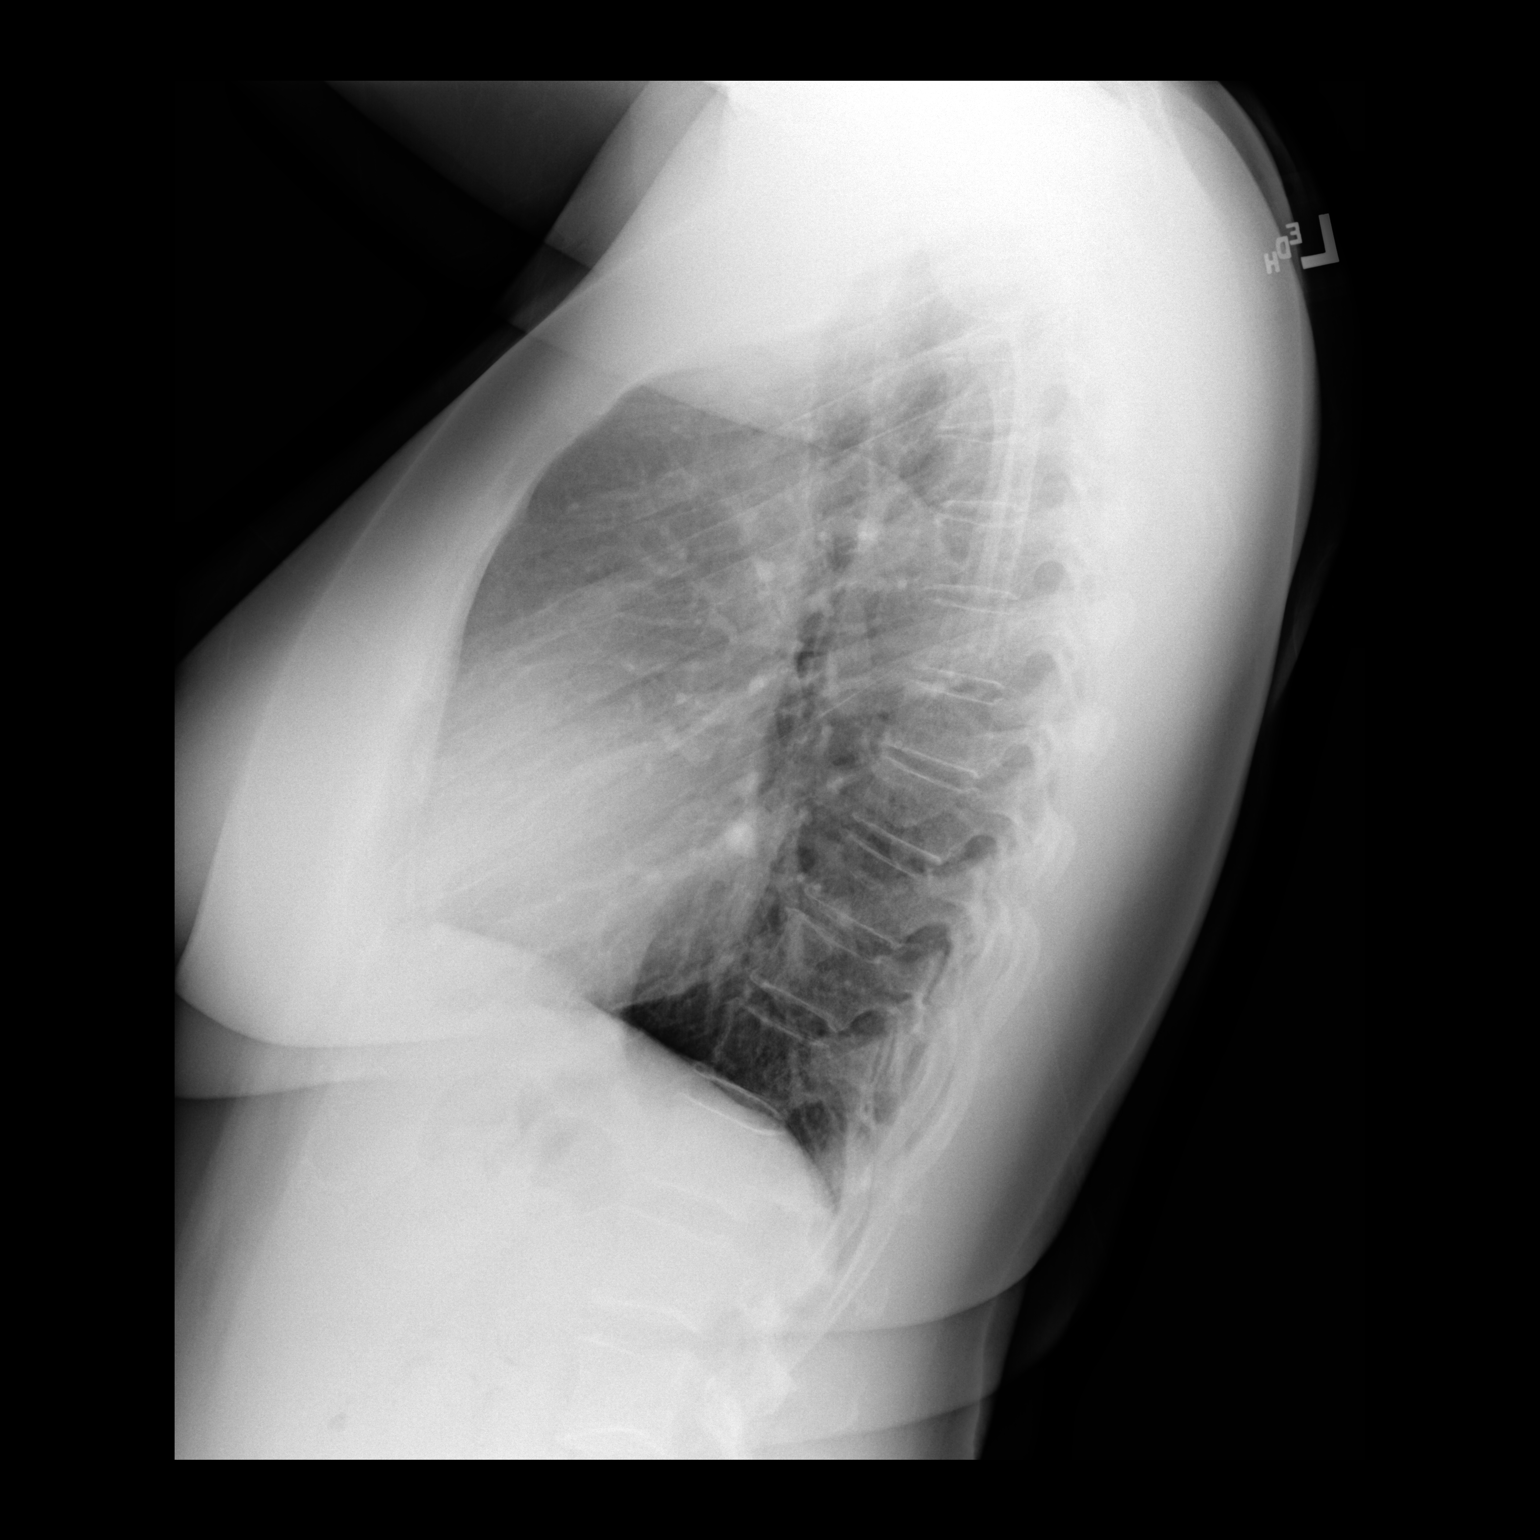

[2 of 2 positions shown; findings below may reference images not displayed]

FINDINGS: The heart size and mediastinal contours are within normal limits.
Both lungs are clear. The visualized skeletal structures are
unremarkable.
IMPRESSION: No active cardiopulmonary disease.

## 2021-04-26 ENCOUNTER — Other Ambulatory Visit: Payer: Self-pay | Admitting: Family Medicine

## 2021-04-26 DIAGNOSIS — R221 Localized swelling, mass and lump, neck: Secondary | ICD-10-CM

## 2021-04-27 ENCOUNTER — Ambulatory Visit
Admission: RE | Admit: 2021-04-27 | Discharge: 2021-04-27 | Disposition: A | Discharge status: Home / Self Care | Payer: 59 | Source: Ambulatory Visit | Attending: Family Medicine | Admitting: Family Medicine

## 2021-04-27 DIAGNOSIS — R221 Localized swelling, mass and lump, neck: Secondary | ICD-10-CM

## 2021-04-28 ENCOUNTER — Other Ambulatory Visit: Payer: Self-pay | Admitting: Family Medicine

## 2021-04-28 DIAGNOSIS — R221 Localized swelling, mass and lump, neck: Secondary | ICD-10-CM

## 2021-07-25 ENCOUNTER — Ambulatory Visit
Admission: RE | Admit: 2021-07-25 | Discharge: 2021-07-25 | Disposition: A | Discharge status: Home / Self Care | Payer: 59 | Source: Ambulatory Visit | Attending: Family Medicine | Admitting: Family Medicine

## 2021-07-25 ENCOUNTER — Other Ambulatory Visit: Payer: Self-pay | Admitting: Family Medicine

## 2021-07-25 DIAGNOSIS — R221 Localized swelling, mass and lump, neck: Secondary | ICD-10-CM

## 2021-07-25 DIAGNOSIS — M25512 Pain in left shoulder: Secondary | ICD-10-CM

## 2021-08-04 ENCOUNTER — Ambulatory Visit (INDEPENDENT_AMBULATORY_CARE_PROVIDER_SITE_OTHER): Payer: 59 | Admitting: Family Medicine

## 2021-08-04 ENCOUNTER — Encounter: Payer: Self-pay | Admitting: Family Medicine

## 2021-08-04 ENCOUNTER — Ambulatory Visit: Payer: Self-pay

## 2021-08-04 VITALS — BP 118/80 | Ht 65.0 in | Wt 183.0 lb

## 2021-08-04 DIAGNOSIS — M5412 Radiculopathy, cervical region: Secondary | ICD-10-CM | POA: Diagnosis not present

## 2021-08-04 DIAGNOSIS — M25512 Pain in left shoulder: Secondary | ICD-10-CM

## 2021-08-04 DIAGNOSIS — M7552 Bursitis of left shoulder: Secondary | ICD-10-CM

## 2021-08-04 MED ORDER — METHYLPREDNISOLONE ACETATE 40 MG/ML IJ SUSP
40.0000 mg | Freq: Once | INTRAMUSCULAR | Status: AC
  Administered 2021-08-04: 40 mg via INTRA_ARTICULAR

## 2021-08-04 NOTE — Patient Instructions (Signed)
Good to see you Please try heat on the neck  Please try ice on the shoulder  Please try the exercises   Please send me a message in MyChart with any questions or updates.  Please see me back in 4 weeks.   --Dr. Jordan Likes

## 2021-08-04 NOTE — Assessment & Plan Note (Signed)
Acutely occurring.  She does have pain that originates at the cervical spine with radiation down the arm which be more consistent with a radicular pattern. -Counseled on home exercise therapy and supportive care. -Could consider gabapentin, physical therapy or further imaging.

## 2021-08-04 NOTE — Assessment & Plan Note (Signed)
Acutely occurring.  There is bursitis with effusion around the shoulder. -Counseled on home exercise therapy and supportive care. -Injection. -Could consider glenohumeral injection or physical therapy.

## 2021-08-04 NOTE — Progress Notes (Signed)
  Stacy [name] - 61 y.o. female MRN [id number]  Date of birth: 22-Jan-1960  SUBJECTIVE:  Including CC & ROS.  No chief complaint on file.   Stacy [name] is a 61 y.o. female that is presenting with acute left shoulder and arm pain.  She has been experiencing this pain for the past few weeks.  She has tried prednisone with limited improvement.  She notices the pain does initiate at the neck and radiate down to her forearm.  No injury inciting event.  No history of surgery.   Review of Systems See HPI   HISTORY: Past Medical, Surgical, Social, and Family History Reviewed & Updated per EMR.   Pertinent Historical Findings include:  Past Medical History:  Diagnosis Date   Arthritis    Genital herpes    Hyperlipidemia    Neuromuscular disorder (HCC)    numbness left 3 toes at night    Past Surgical History:  Procedure Laterality Date   ABDOMINAL HYSTERECTOMY     CESAREAN SECTION     COLONOSCOPY       PHYSICAL EXAM:  VS: BP 118/80 (BP Location: Right Arm, Patient Position: Sitting)   Ht 5\' 5"  (1.651 m)   Wt 183 lb (83 kg)   BMI 30.45 kg/m  Physical Exam Gen: NAD, alert, cooperative with exam, well-appearing MSK:  Neurovascularly intact    Limited ultrasound: Left shoulder:  Effusion appreciated within the bicipital tendon sheath. Normal-appearing subscapularis. Overlying bursitis appreciated of the supraspinatus. Normal-appearing posterior glenohumeral joint  Summary: Subacromial bursitis  Ultrasound and interpretation by , MD   Aspiration/Injection Procedure Note Stacy [name] [birth year]  Procedure: Injection Indications: Left shoulder pain  Procedure Details Consent: Risks of procedure as well as the alternatives and risks of each were explained to the (patient/caregiver).  Consent for procedure obtained. Time Out: Verified patient identification, verified procedure, site/side was marked, verified correct patient position, special  equipment/implants available, medications/allergies/relevent history reviewed, required imaging and test results available.  Performed.  The area was cleaned with iodine and alcohol swabs.    The left subacromial space was injected using 1 cc's of 40 mg Depo-Medrol and 4 cc's of 0.25% bupivacaine with a 22 1 1/2" needle.  Ultrasound was used. Images were obtained in long views showing the injection.     A sterile dressing was applied.  Patient did tolerate procedure well.    ASSESSMENT & PLAN:   Cervical radiculopathy Acutely occurring.  She does have pain that originates at the cervical spine with radiation down the arm which be more consistent with a radicular pattern. -Counseled on home exercise therapy and supportive care. -Could consider gabapentin, physical therapy or further imaging.  Subacromial bursitis of left shoulder joint Acutely occurring.  There is bursitis with effusion around the shoulder. -Counseled on home exercise therapy and supportive care. -Injection. -Could consider glenohumeral injection or physical therapy.

## 2021-09-05 ENCOUNTER — Ambulatory Visit: Payer: 59 | Admitting: Family Medicine

## 2022-05-02 ENCOUNTER — Encounter: Payer: Self-pay | Admitting: *Deleted

## 2022-06-14 ENCOUNTER — Encounter: Payer: Self-pay | Admitting: Gastroenterology

## 2022-09-14 ENCOUNTER — Ambulatory Visit (AMBULATORY_SURGERY_CENTER): Payer: 59

## 2022-09-14 VITALS — Ht 65.75 in | Wt 185.0 lb

## 2022-09-14 DIAGNOSIS — Z800 Family history of malignant neoplasm of digestive organs: Secondary | ICD-10-CM

## 2022-09-14 MED ORDER — NA SULFATE-K SULFATE-MG SULF 17.5-3.13-1.6 GM/177ML PO SOLN: 1.0000 | Freq: Once | ORAL | 0 refills | Status: AC

## 2022-09-14 NOTE — Progress Notes (Signed)
No egg or soy allergy known to patient  No issues known to pt with past sedation with any surgeries or procedures Patient denies ever being told they had issues or difficulty with intubation  No FH of Malignant Hyperthermia Pt is not on diet pills Pt is not on  home 02  Pt is not on blood thinners  Pt denies issues with constipation  No A fib or A flutter Have any cardiac testing pending--no Patient's chart reviewed by John Nulty CNRA prior to previsit and patient appropriate for the [facility].  Previsit completed and red dot placed by patient's name on their procedure day (on provider's schedule).    Pt instructed to use Singlecare.com or GoodRx for a price reduction on prep  Ambulates independently. 

## 2022-10-11 ENCOUNTER — Encounter: Payer: 59 | Admitting: Gastroenterology

## 2022-10-12 ENCOUNTER — Encounter: Payer: Self-pay | Admitting: Gastroenterology

## 2022-10-18 ENCOUNTER — Encounter: Payer: Self-pay | Admitting: Certified Registered Nurse Anesthetist

## 2022-10-20 ENCOUNTER — Encounter: Payer: Self-pay | Admitting: Gastroenterology

## 2022-10-20 ENCOUNTER — Ambulatory Visit (AMBULATORY_SURGERY_CENTER): Payer: 59 | Admitting: Gastroenterology

## 2022-10-20 VITALS — BP 125/83 | HR 70 | Temp 98.1°F | Resp 16 | Ht 65.75 in | Wt 185.0 lb

## 2022-10-20 DIAGNOSIS — Z800 Family history of malignant neoplasm of digestive organs: Secondary | ICD-10-CM

## 2022-10-20 DIAGNOSIS — Z1211 Encounter for screening for malignant neoplasm of colon: Secondary | ICD-10-CM | POA: Diagnosis present

## 2022-10-20 DIAGNOSIS — D123 Benign neoplasm of transverse colon: Secondary | ICD-10-CM

## 2022-10-20 MED ORDER — SODIUM CHLORIDE 0.9 % IV SOLN: 500.0000 mL | Freq: Once | INTRAVENOUS | Status: DC

## 2022-10-20 NOTE — Progress Notes (Signed)
Report given to PACU, vss 

## 2022-10-20 NOTE — Progress Notes (Signed)
[facility] History and Physical   Primary Care Physician:  Shon Hale, MD   Reason for Procedure:  Family history of colon cancer  Plan:    Screening colonoscopy with possible interventions as needed     HPI: Stacy [name] is a very pleasant 62 y.o. female here for screening colonoscopy. Denies any nausea, vomiting, abdominal pain, melena or bright red blood per rectum  The risks and benefits as well as alternatives of endoscopic procedure(s) have been discussed and reviewed. All questions answered. The patient agrees to proceed.    Past Medical History:  Diagnosis Date   Arthritis    Genital herpes    Hyperlipidemia    Neuromuscular disorder (HCC)    numbness left 3 toes at night    Past Surgical History:  Procedure Laterality Date   ABDOMINAL HYSTERECTOMY     CESAREAN SECTION     COLONOSCOPY     fibroid tumor  2006    Prior to Admission medications   Medication Sig Start Date End Date Taking? Authorizing Provider  Cholecalciferol (VITAMIN D3) 50 MCG (2000 UT) capsule Take 2,000 Units by mouth daily.   Yes [provider]  lovastatin (MEVACOR) 20 MG tablet Take 20 mg by mouth at bedtime.     Yes [provider]  pyridOXINE (VITAMIN B6) 100 MG tablet Take 100 mg by mouth daily.   Yes [provider]  valACYclovir (VALTREX) 500 MG tablet Take 500 mg by mouth daily.     Yes [provider]  calcium-vitamin D (OSCAL WITH D) 500-200 MG-UNIT per tablet Take 1 tablet by mouth 2 (two) times daily.   Patient not taking: Reported on 09/14/2022    [provider]  ergocalciferol (VITAMIN D2) 50000 UNITS capsule Take 50,000 Units by mouth once a week.   Patient not taking: Reported on 09/14/2022    [provider]  ferrous sulfate 325 (65 FE) MG tablet Take 325 mg by mouth daily with breakfast. Patient not taking: Reported on 09/14/2022    [provider]  levofloxacin (LEVAQUIN) 500 MG  tablet Take 1 tablet (500 mg total) by mouth daily. Patient not taking: Reported on 10/09/2017 04/06/12   Elvina Sidle, MD  Multiple Vitamin (MULTIVITAMIN PO) Take by mouth. Multi vitamin Gummies Patient not taking: Reported on 09/14/2022    [provider]  OVER THE COUNTER MEDICATION Menopause support Patient not taking: Reported on 09/14/2022    [provider]    Current Outpatient Medications  Medication Sig Dispense Refill   Cholecalciferol (VITAMIN D3) 50 MCG (2000 UT) capsule Take 2,000 Units by mouth daily.     lovastatin (MEVACOR) 20 MG tablet Take 20 mg by mouth at bedtime.       pyridOXINE (VITAMIN B6) 100 MG tablet Take 100 mg by mouth daily.     valACYclovir (VALTREX) 500 MG tablet Take 500 mg by mouth daily.       calcium-vitamin D (OSCAL WITH D) 500-200 MG-UNIT per tablet Take 1 tablet by mouth 2 (two) times daily.   (Patient not taking: Reported on 09/14/2022)     ergocalciferol (VITAMIN D2) 50000 UNITS capsule Take 50,000 Units by mouth once a week.   (Patient not taking: Reported on 09/14/2022)     ferrous sulfate 325 (65 FE) MG tablet Take 325 mg by mouth daily with breakfast. (Patient not taking: Reported on 09/14/2022)     levofloxacin (LEVAQUIN) 500 MG tablet Take 1 tablet (500 mg total) by mouth daily. (  Patient not taking: Reported on 10/09/2017) 7 tablet 0   Multiple Vitamin (MULTIVITAMIN PO) Take by mouth. Multi vitamin Gummies (Patient not taking: Reported on 09/14/2022)     OVER THE COUNTER MEDICATION Menopause support (Patient not taking: Reported on 09/14/2022)     Current Facility-Administered Medications  Medication Dose Route Frequency Provider Last Rate Last Admin   0.9 %  sodium chloride infusion  500 mL Intravenous Continuous Hart Carwin, MD       0.9 %  sodium chloride infusion  500 mL Intravenous Once Joliet Mallozzi V, MD       0.9 %  sodium chloride infusion  500 mL Intravenous Once Napoleon Form, MD        Allergies as of  10/20/2022   (No Known Allergies)    Family History  Problem Relation Age of Onset   Stomach cancer Mother    Diabetes Mother    Hyperlipidemia Mother    Colon cancer Father    Diabetes Father    Hyperlipidemia Father    Colon polyps Neg Hx    Esophageal cancer Neg Hx    Rectal cancer Neg Hx     Social History   Socioeconomic History   Marital status: Married    Spouse name: Not on file   Number of children: Not on file   Years of education: Not on file   Highest education level: Not on file  Occupational History   Not on file  Tobacco Use   Smoking status: Former    Current packs/day: 0.30    Types: Cigarettes   Smokeless tobacco: Never  Vaping Use   Vaping status: Never Used  Substance and Sexual Activity   Alcohol use: Yes    Comment: occasional alcohol intake   Drug use: No   Sexual activity: Not on file  Other Topics Concern   Not on file  Social History Narrative   Not on file   Social Determinants of Health   Financial Resource Strain: Not on file  Food Insecurity: Not on file  Transportation Needs: Not on file  Physical Activity: Not on file  Stress: Not on file  Social Connections: Unknown (05/31/2021)   Received from [facility], [facility]   Social Network    Social Network: Not on file  Intimate Partner Violence: Unknown (04/22/2021)   Received from [facility], [facility]   HITS    Physically Hurt: Not on file    Insult or Talk Down To: Not on file    Threaten Physical Harm: Not on file    Scream or Curse: Not on file    Review of Systems:  All other review of systems negative except as mentioned in the HPI.  Physical Exam: Vital signs in last 24 hours: BP 135/75   Pulse 70   Temp 98.1 F (36.7 C)   Ht 5' 5.75" (1.67 m)   Wt 185 lb (83.9 kg)   SpO2 99%   BMI 30.09 kg/m  General:   Alert, NAD Lungs:  Clear .   Heart:  Regular rate and rhythm Abdomen:  Soft, nontender and nondistended. Neuro/Psych:  Alert and  cooperative. Normal mood and affect. A and O x 3  Reviewed labs, radiology imaging, old records and pertinent past GI work up  Patient is appropriate for planned procedure(s) and anesthesia in an ambulatory setting   K. Scherry Ran , MD [phone]

## 2022-10-20 NOTE — Patient Instructions (Signed)
-  Handout on polyps diverticulosis and hemorrhoids provided -await pathology results -repeat colonoscopy for surveillance recommended. Date to be determined when pathology result become available   -Continue present medications   YOU HAD AN ENDOSCOPIC PROCEDURE TODAY AT THE [facility]:   Refer to the procedure report that was given to you for any specific questions about what was found during the examination.  If the procedure report does not answer your questions, please call your gastroenterologist to clarify.  If you requested that your care partner not be given the details of your procedure findings, then the procedure report has been included in a sealed envelope for you to review at your convenience later.  YOU SHOULD EXPECT: Some feelings of bloating in the abdomen. Passage of more gas than usual.  Walking can help get rid of the air that was put into your GI tract during the procedure and reduce the bloating. If you had a lower endoscopy (such as a colonoscopy or flexible sigmoidoscopy) you may notice spotting of blood in your stool or on the toilet paper. If you underwent a bowel prep for your procedure, you may not have a normal bowel movement for a few days.  Please Note:  You might notice some irritation and congestion in your nose or some drainage.  This is from the oxygen used during your procedure.  There is no need for concern and it should clear up in a day or so.  SYMPTOMS TO REPORT IMMEDIATELY:  Following lower endoscopy (colonoscopy or flexible sigmoidoscopy):  Excessive amounts of blood in the stool  Significant tenderness or worsening of abdominal pains  Swelling of the abdomen that is new, acute  Fever of 100F or higher   For urgent or emergent issues, a gastroenterologist can be reached at any hour by calling [phone]. Do not use MyChart messaging for urgent concerns.    DIET:  We do recommend a small meal at first, but then you may proceed to your  regular diet.  Drink plenty of fluids but you should avoid alcoholic beverages for 24 hours.  ACTIVITY:  You should plan to take it easy for the rest of today and you should NOT DRIVE or use heavy machinery until tomorrow (because of the sedation medicines used during the test).    FOLLOW UP: Our staff will call the number listed on your records the next business day following your procedure.  We will call around 7:15- 8:00 am to check on you and address any questions or concerns that you may have regarding the information given to you following your procedure. If we do not reach you, we will leave a message.     If any biopsies were taken you will be contacted by phone or by letter within the next 1-3 weeks.  Please call us at [phone] if you have not heard about the biopsies in 3 weeks.    SIGNATURES/CONFIDENTIALITY: You and/or your care partner have signed paperwork which will be entered into your electronic medical record.  These signatures attest to the fact that that the information above on your After Visit Summary has been reviewed and is understood.  Full responsibility of the confidentiality of this discharge information lies with you and/or your care-partner.

## 2022-10-20 NOTE — Op Note (Signed)
[facility] Patient Name: Stacy [name] Procedure Date: 10/20/2022 11:26 AM MRN: [id number] Endoscopist: Napoleon Form , MD, [id number] Age: 62 Referring MD:  Date of Birth: 01-20-1960 Gender: Female Account #: [id number] Procedure:                Colonoscopy Indications:              Screening in patient at increased risk: Family                            history of 1st-degree relative with colorectal                            cancer Medicines:                Monitored Anesthesia Care Procedure:                Pre-Anesthesia Assessment:                           - Prior to the procedure, a History and Physical                            was performed, and patient medications and                            allergies were reviewed. The patient's tolerance of                            previous anesthesia was also reviewed. The risks                            and benefits of the procedure and the sedation                            options and risks were discussed with the patient.                            All questions were answered, and informed consent                            was obtained. Prior Anticoagulants: The patient has                            taken no anticoagulant or antiplatelet agents. ASA                            Grade Assessment: II - A patient with mild systemic                            disease. After reviewing the risks and benefits,                            the patient was deemed in satisfactory condition to  undergo the procedure.                           After obtaining informed consent, the colonoscope                            was passed under direct vision. Throughout the                            procedure, the patient's blood pressure, pulse, and                            oxygen saturations were monitored continuously. The                            Olympus Scope [id number] was introduced through  the                            anus and advanced to the the cecum, identified by                            appendiceal orifice and ileocecal valve. The                            colonoscopy was performed without difficulty. The                            patient tolerated the procedure well. The quality                            of the bowel preparation was good. The ileocecal                            valve, appendiceal orifice, and rectum were                            photographed. Scope In: 11:39:17 AM Scope Out: 11:50:49 AM Scope Withdrawal Time: 0 hours 9 minutes 9 seconds  Total Procedure Duration: 0 hours 11 minutes 32 seconds  Findings:                 The perianal and digital rectal examinations were                            normal.                           A 4 mm polyp was found in the transverse colon. The                            polyp was sessile. The polyp was removed with a                            cold snare. Resection and retrieval were complete.  Scattered medium-mouthed and small-mouthed                            diverticula were found in the sigmoid colon and                            descending colon. There was evidence of                            diverticular spasm. Peri-diverticular erythema was                            seen.                           Non-bleeding external and internal hemorrhoids were                            found during retroflexion. The hemorrhoids were                            medium-sized. Complications:            No immediate complications. Estimated Blood Loss:     Estimated blood loss was minimal. Impression:               - One 4 mm polyp in the transverse colon, removed                            with a cold snare. Resected and retrieved.                           - Moderate diverticulosis in the sigmoid colon and                            in the descending colon. There was evidence of                             diverticular spasm. Peri-diverticular erythema was                            seen.                           - Non-bleeding external and internal hemorrhoids. Recommendation:           - Patient has a contact number available for                            emergencies. The signs and symptoms of potential                            delayed complications were discussed with the                            patient. Return to normal activities tomorrow.  Written discharge instructions were provided to the                            patient.                           - Resume previous diet.                           - Continue present medications.                           - Await pathology results.                           - Repeat colonoscopy in 5 years for surveillance                            based on pathology results. Napoleon Form, MD 10/20/2022 11:58:05 AM This report has been signed electronically.

## 2022-10-20 NOTE — Progress Notes (Signed)
Pt's states no medical or surgical changes since previsit or office visit. 

## 2022-10-20 NOTE — Progress Notes (Signed)
Called to room to assist during endoscopic procedure.  Patient ID and intended procedure confirmed with present staff. Received instructions for my participation in the procedure from the performing physician.  

## 2022-10-23 ENCOUNTER — Telehealth: Payer: Self-pay

## 2022-10-23 NOTE — Telephone Encounter (Signed)
  Follow up Call-     10/20/2022   10:57 AM  Call back number  Post procedure Call Back phone  # [phone]  Permission to leave phone message Yes     Patient questions:  Do you have a fever, pain , or abdominal swelling? No. Pain Score  0 *  Have you tolerated food without any problems? Yes.    Have you been able to return to your normal activities? Yes.    Do you have any questions about your discharge instructions: Diet   No. Medications  No. Follow up visit  No.  Do you have questions or concerns about your Care? No.  Actions: * If pain score is 4 or above: No action needed, pain <4.

## 2022-10-24 LAB — SURGICAL PATHOLOGY

## 2022-11-02 ENCOUNTER — Encounter: Payer: Self-pay | Admitting: Gastroenterology

## 2022-11-17 ENCOUNTER — Encounter (INDEPENDENT_AMBULATORY_CARE_PROVIDER_SITE_OTHER): Payer: Self-pay | Admitting: Otolaryngology

## 2023-01-01 ENCOUNTER — Encounter (INDEPENDENT_AMBULATORY_CARE_PROVIDER_SITE_OTHER): Payer: Self-pay | Admitting: Otolaryngology

## 2023-01-01 ENCOUNTER — Ambulatory Visit (INDEPENDENT_AMBULATORY_CARE_PROVIDER_SITE_OTHER): Payer: 59 | Admitting: Otolaryngology

## 2023-01-01 VITALS — BP 128/75 | HR 82 | Ht 65.75 in | Wt 183.0 lb

## 2023-01-01 DIAGNOSIS — J351 Hypertrophy of tonsils: Secondary | ICD-10-CM

## 2023-01-01 DIAGNOSIS — R09A2 Foreign body sensation, throat: Secondary | ICD-10-CM

## 2023-01-01 DIAGNOSIS — Z87891 Personal history of nicotine dependence: Secondary | ICD-10-CM

## 2023-01-01 DIAGNOSIS — R0981 Nasal congestion: Secondary | ICD-10-CM

## 2023-01-01 DIAGNOSIS — J342 Deviated nasal septum: Secondary | ICD-10-CM | POA: Diagnosis not present

## 2023-01-01 DIAGNOSIS — K219 Gastro-esophageal reflux disease without esophagitis: Secondary | ICD-10-CM

## 2023-01-01 DIAGNOSIS — R0982 Postnasal drip: Secondary | ICD-10-CM | POA: Diagnosis not present

## 2023-01-01 DIAGNOSIS — J343 Hypertrophy of nasal turbinates: Secondary | ICD-10-CM

## 2023-01-01 DIAGNOSIS — J3089 Other allergic rhinitis: Secondary | ICD-10-CM

## 2023-01-01 MED ORDER — FAMOTIDINE 20 MG PO TABS: 20.0000 mg | ORAL_TABLET | Freq: Two times a day (BID) | ORAL | 3 refills | Status: AC

## 2023-01-01 MED ORDER — FLUTICASONE PROPIONATE 50 MCG/ACT NA SUSP: 2.0000 | Freq: Every day | NASAL | 6 refills | Status: AC

## 2023-01-01 MED ORDER — CETIRIZINE HCL 10 MG PO TABS: 10.0000 mg | ORAL_TABLET | Freq: Every day | ORAL | 11 refills | Status: AC

## 2023-01-01 NOTE — Patient Instructions (Addendum)
-   start Pepcid 20 mg twice daily  - start Zyrtec and Flonase for post-nasal drainage   [email or web address] - check out this website to learn more about reflux   -Avoid lying down for at least two hours after a meal or after drinking acidic beverages, like soda, or other caffeinated beverages. This can help to prevent stomach contents from flowing back into the esophagus. -Keep your head elevated while you sleep. Using an extra pillow or two can also help to prevent reflux. -Eat smaller and more frequent meals each day instead of a few large meals. This promotes digestion and can aid in preventing heartburn. -Wear loose-fitting clothes to ease pressure on the stomach, which can worsen heartburn and reflux. -Reduce excess weight around the midsection. This can ease pressure on the stomach. Such pressure can force some stomach contents back up the esophagus  - Take Reflux Gourmet (natural supplement available on [employer]) to help with symptoms of chronic throat irritation

## 2023-01-01 NOTE — Progress Notes (Signed)
ENT CONSULT:  Reason for Consult: tonsil stones globus sensation   HPI: Discussed the use of AI scribe software for clinical note transcription with the patient, who gave verbal consent to proceed.  History of Present Illness   The patient is a 44 yoF, with a history of reflux, presents with concerns of possible tonsil stones and globus sensation. They report noticing an issue approximately six months ago, describing the expulsion of a small, off-white ball from their throat. They have a history of chronic nasal congestion, postnasal drainage, but their sx are seasonal.  The patient reports no pain with swallowing but describes a sensation of a lump that seems to descend slowly, particularly with certain foods. They deny any significant pain, but note occasional throat soreness, which they manage by gargling with salt water.  The patient has a history of heartburn, previously managed with an over-the-counter medication starting with a 'P,' possibly pantoprazole or protonix. They report cessation of this medication after symptoms resolved. They also mention occasional use of nasal spray, approximately once or twice a year.  The patient has no history of tonsillectomy and denies any significant nasal symptoms. They report a previous procedure to remove a piece of fatty tissue from a left tear duct by Ophtho  The patient has made recent lifestyle changes, including cessation of smoking and attempts to eat earlier in the evening to aid weight loss and manage potential reflux symptoms.         Past Medical History:  Diagnosis Date   Arthritis    Genital herpes    Hyperlipidemia    Neuromuscular disorder (HCC)    numbness left 3 toes at night    Past Surgical History:  Procedure Laterality Date   ABDOMINAL HYSTERECTOMY     CESAREAN SECTION     COLONOSCOPY     fibroid tumor  2006    Family History  Problem Relation Age of Onset   Stomach cancer Mother    Diabetes Mother     Hyperlipidemia Mother    Colon cancer Father    Diabetes Father    Hyperlipidemia Father    Colon polyps Neg Hx    Esophageal cancer Neg Hx    Rectal cancer Neg Hx     Social History:  reports that she has quit smoking. Her smoking use included cigarettes. She has never used smokeless tobacco. She reports current alcohol use. She reports that she does not use drugs.  Allergies: No Known Allergies  Medications: I have reviewed the patient's current medications.  The PMH, PSH, Medications, Allergies, and SH were reviewed and updated.  ROS: Constitutional: Negative for fever, weight loss and weight gain. Cardiovascular: Negative for chest pain and dyspnea on exertion. Respiratory: Is not experiencing shortness of breath at rest. Gastrointestinal: Negative for nausea and vomiting. Neurological: Negative for headaches. Psychiatric: The patient is not nervous/anxious  Blood pressure 128/75, pulse 82, height 5' 5.75" (1.67 m), weight 183 lb (83 kg), SpO2 98%.  PHYSICAL EXAM:  Exam: General: Well-developed, well-nourished Communication and Voice: Clear pitch and clarity Respiratory Respiratory effort: Equal inspiration and expiration without stridor Cardiovascular Peripheral Vascular: Warm extremities with equal color/perfusion Eyes: No nystagmus with equal extraocular motion bilaterally Neuro/Psych/Balance: Patient oriented to person, place, and time; Appropriate mood and affect; Gait is intact with no imbalance; Cranial nerves I-XII are intact Head and Face Inspection: Normocephalic and atraumatic without mass or lesion Palpation: Facial skeleton intact without bony stepoffs Salivary Glands: No mass or tenderness Facial Strength: Facial motility  symmetric and full bilaterally ENT Pinna: External ear intact and fully developed External canal: Canal is patent with intact skin Tympanic Membrane: Clear and mobile External Nose: No scar or anatomic deformity Internal Nose: Septum  is deviated to the left. No polyp, or purulence. Mucosal edema and erythema present.  Bilateral inferior turbinate hypertrophy.  Lips, Teeth, and gums: Mucosa and teeth intact and viable TMJ: No pain to palpation with full mobility Oral cavity/oropharynx: No erythema or exudate, no lesions present Nasopharynx: No mass or lesion with intact mucosa Hypopharynx: Intact mucosa without pooling of secretions Larynx Glottic: Full true vocal cord mobility without lesion or mass Supraglottic: Normal appearing epiglottis and AE folds Interarytenoid Space: Moderate pachydermia&edema Subglottic Space: Patent without lesion or edema Neck Neck and Trachea: Midline trachea without mass or lesion Thyroid: No mass or nodularity Lymphatics: No lymphadenopathy  Procedure: Preoperative diagnosis: globus sensation   Postoperative diagnosis:   Same + GERD LPR NSD and ITH   Procedure: Flexible fiberoptic laryngoscopy  Surgeon: Ashok Croon, MD  Anesthesia: Topical lidocaine and Afrin Complications: None Condition is stable throughout exam  Indications and consent:  The patient presents to the clinic with globus sensation Indirect laryngoscopy view was incomplete. Thus it was recommended that they undergo a flexible fiberoptic laryngoscopy. All of the risks, benefits, and potential complications were reviewed with the patient preoperatively and verbal informed consent was obtained.  Procedure: The patient was seated upright in the clinic. Topical lidocaine and Afrin were applied to the nasal cavity. After adequate anesthesia had occurred, I then proceeded to pass the flexible telescope into the nasal cavity. The nasal cavity was patent without rhinorrhea or polyp. The nasopharynx was also patent without mass or lesion. The base of tongue was visualized and was normal. There were no signs of pooling of secretions in the piriform sinuses. The true vocal folds were mobile bilaterally. There were no signs of  glottic or supraglottic mucosal lesion or mass. There was moderate interarytenoid pachydermia and post cricoid edema. The telescope was then slowly withdrawn and the patient tolerated the procedure throughout.    PROCEDURE NOTE: nasal endoscopy  Preoperative diagnosis: post-nasal drainage and nasal congestion symptoms  Postoperative diagnosis: same  Procedure: Diagnostic nasal endoscopy (41324)  Surgeon: Ashok Croon, M.D.  Anesthesia: Topical lidocaine and Afrin  H&P REVIEW: The patient's history and physical were reviewed today prior to procedure. All medications were reviewed and updated as well. Complications: None Condition is stable throughout exam Indications and consent: The patient presents with symptoms of chronic sinusitis not responding to previous therapies. All the risks, benefits, and potential complications were reviewed with the patient preoperatively and informed consent was obtained. The time out was completed with confirmation of the correct procedure.   Procedure: The patient was seated upright in the clinic. Topical lidocaine and Afrin were applied to the nasal cavity. After adequate anesthesia had occurred, the rigid nasal endoscope was passed into the nasal cavity. The nasal mucosa, turbinates, septum, and sinus drainage pathways were visualized bilaterally. This revealed no purulence or significant secretions that might be cultured. There were no polyps or sites of significant inflammation. The mucosa was intact and there was no crusting present. The scope was then slowly withdrawn and the patient tolerated the procedure well. There were no complications or blood loss.      Studies Reviewed:U/S neck soft tissues 07/25/21 FINDINGS: Focused sonographic exam of the bilateral soft tissues of the neck was performed in the areas of interest. Images demonstrate normal-appearing lymph nodes  which measure less than 1 cm in the short axis with preserved lentiform  morphology/fatty hilum. Specifically, the prominent lymph node in the right lateral neck appears to demonstrate slight decrease in size.   IMPRESSION: Bilateral cervical lymph nodes demonstrate normal size and appearance  Assessment/Plan: Encounter Diagnoses  Name Primary?   Globus sensation Yes   Chronic GERD    Nasal septal deviation    Chronic nasal congestion    Post-nasal drip    Environmental and seasonal allergies    Tonsillar hypertrophy    Hypertrophy of both inferior nasal turbinates    History of smoking     Assessment and Plan  Globus sensation and hx of chronic GERD  Throat irritation and sensation of a lump in the throat indicative of silent reflux. Discussed diet and lifestyle changes. Recommended famotidine (Pepcid) twice daily and a seaweed-based supplement from [employer] to be taken after dinner. - Prescribe famotidine (Pepcid) 20 mg twice daily - Provide dietary and lifestyle modification handout - Recommend a seaweed-based supplement Reflux Gourmet from [employer] to be taken after dinner and other meals - Advise avoiding late meals and lying down immediately after eating Scope exam including flexible laryngoscopy unremarkable with b/l VF mobility intact, and no masses or lesions, she did have post-cricoid edema pachydermia     Chronic nasal congestion and post-nasal drainage with Nasal Septal Deviation on the left and inferior turbinate hypertrophy and mucosal edema no polyps or pus  Septal deviation with curvature more to the left, worsening nasal congestion on the left. Confirmed during nasal endoscopy. No other significant nasal findings. - Consider further evaluation if symptoms worsen - Recommend Zyrtec 10 mg daily for suspected allergies - Recommend Flonase nasal spray 2 puffs b/l nares BID  She did not have tonsil stones on my exam, her tonsils were 1+ and symmetric. I reassured the patient and advised to gargle with salt water if she coughs up anything  else in the future that resembles tonsil stones   General Health Maintenance Smoking cessation achieved, beneficial for overall health. - Encourage continued smoking cessation - spent total of 3 minutes counseling her to remain smoke free   Follow-up - Schedule follow-up appointment if symptoms persist.        Thank you for allowing me to participate in the care of this patient. Please do not hesitate to contact me with any questions or concerns.   Ashok Croon, MD Otolaryngology [facility] Phone: [phone] Fax: [phone]    01/01/2023, 4:54 PM

## 2024-01-25 ENCOUNTER — Other Ambulatory Visit (INDEPENDENT_AMBULATORY_CARE_PROVIDER_SITE_OTHER): Payer: Self-pay | Admitting: Otolaryngology

## 2024-02-07 ENCOUNTER — Other Ambulatory Visit: Payer: Self-pay | Admitting: Family Medicine

## 2024-02-07 DIAGNOSIS — Z122 Encounter for screening for malignant neoplasm of respiratory organs: Secondary | ICD-10-CM
